# Patient Record
Sex: Female | Born: 1960 | Race: Black or African American | Hispanic: No | Marital: Single | Smoking: Never smoker
Health system: Southern US, Community
[De-identification: ages and names within clinical notes are randomized; demographics above are authoritative.]

## PROBLEM LIST (undated history)

## (undated) DIAGNOSIS — I10 Essential (primary) hypertension: Secondary | ICD-10-CM

## (undated) DIAGNOSIS — E119 Type 2 diabetes mellitus without complications: Secondary | ICD-10-CM

---

## 2017-10-24 ENCOUNTER — Emergency Department (HOSPITAL_COMMUNITY)
Admission: EM | Admit: 2017-10-24 | Discharge: 2017-10-25 | Disposition: A | Discharge status: Home / Self Care | Payer: Non-veteran care | Attending: Emergency Medicine | Admitting: Emergency Medicine

## 2017-10-24 DIAGNOSIS — R0981 Nasal congestion: Secondary | ICD-10-CM | POA: Diagnosis not present

## 2017-10-24 DIAGNOSIS — R0982 Postnasal drip: Secondary | ICD-10-CM | POA: Insufficient documentation

## 2017-10-24 DIAGNOSIS — R05 Cough: Secondary | ICD-10-CM

## 2017-10-24 DIAGNOSIS — R059 Cough, unspecified: Secondary | ICD-10-CM

## 2017-10-24 DIAGNOSIS — E119 Type 2 diabetes mellitus without complications: Secondary | ICD-10-CM | POA: Insufficient documentation

## 2017-10-24 DIAGNOSIS — I10 Essential (primary) hypertension: Secondary | ICD-10-CM | POA: Diagnosis not present

## 2017-10-24 HISTORY — DX: Essential (primary) hypertension: I10

## 2017-10-24 HISTORY — DX: Type 2 diabetes mellitus without complications: E11.9

## 2017-10-25 ENCOUNTER — Encounter (HOSPITAL_COMMUNITY): Payer: Self-pay

## 2017-10-25 ENCOUNTER — Emergency Department (HOSPITAL_COMMUNITY): Payer: Non-veteran care

## 2017-10-25 NOTE — ED Triage Notes (Signed)
Pt reports that she has had a cough with productive green phlegm, denies fevers, [facility] and [facility] and states that cough is not getting better. Pt also asking for resources for homeless shelter.

## 2017-10-25 NOTE — ED Notes (Signed)
Pt ready for discharge VS documented 

## 2017-10-25 NOTE — ED Provider Notes (Signed)
[facility] EMERGENCY DEPARTMENT Provider Note   CSN: [id number] Arrival date & time: 10/24/17  2349     History   Chief Complaint Chief Complaint  Patient presents with  . Cough    HPI Jill Wiggins is a 57 y.o. female.   57 year old female with a history of hypertension, diabetes presents to the ED for complaints of cough.  She has had a cough productive of green phlegm with associated nasal congestion, postnasal drip, sore throat for 1.5 weeks.  She was started on amoxicillin 2 days ago which she has been taking as prescribed.  She feels as though cough drops help slightly with her symptoms.  She is concerned, however, as her cough has persisted despite taking an antibiotic.  Patient expresses that she wants to ensure there is no fluid in her lungs.  She has not had any fevers.  No reported sick contacts.     Past Medical History:  Diagnosis Date  . Diabetes mellitus without complication (HCC)   . Hypertension     There are no active problems to display for this patient.   History reviewed. No pertinent surgical history.   OB History   None      Home Medications    Prior to Admission medications   Not on File    Family History No family history on file.  Social History Social History   Tobacco Use  . Smoking status: Never Smoker  . Smokeless tobacco: Never Used  Substance Use Topics  . Alcohol use: Never    Frequency: Never  . Drug use: Never     Allergies   Patient has no known allergies.   Review of Systems Review of Systems Ten systems reviewed and are negative for acute change, except as noted in the HPI.    Physical Exam Updated Vital Signs BP (!) 150/93 (BP Location: Right Arm)   Pulse 94   Temp 98.6 F (37 C) (Oral)   Resp 18   Ht 5\' 7"  (1.702 m)   Wt 86.2 kg (190 lb)   SpO2 97%   BMI 29.76 kg/m   Physical Exam  Constitutional: She is oriented to person, place, and time. She appears well-developed and  well-nourished. No distress.  Nontoxic appearing and in NAD  HENT:  Head: Normocephalic and atraumatic.  Eyes: Conjunctivae and EOM are normal. No scleral icterus.  Neck: Normal range of motion.  Cardiovascular: Normal rate, regular rhythm and intact distal pulses.  Pulmonary/Chest: Effort normal. No stridor. No respiratory distress. She has no wheezes. She has no rales.  Dry, hacking cough. Lungs CTAB.  Musculoskeletal: Normal range of motion.  Neurological: She is alert and oriented to person, place, and time. She exhibits normal muscle tone. Coordination normal.  GCS 15. Patient moving all extremities.  Skin: Skin is warm and dry. No rash noted. She is not diaphoretic. No erythema. No pallor.  Psychiatric: She has a normal mood and affect. Her behavior is normal.  Nursing note and vitals reviewed.    ED Treatments / Results  Labs (all labs ordered are listed, but only abnormal results are displayed) Labs Reviewed - No data to display  EKG None  Radiology Dg Chest 2 View  Result Date: 10/25/2017 CLINICAL DATA:  Cough EXAM: CHEST - 2 VIEW COMPARISON:  None. FINDINGS: The heart size and mediastinal contours are within normal limits. Both lungs are clear. The visualized skeletal structures are unremarkable. IMPRESSION: No active cardiopulmonary disease. Electronically Signed   By:  Jasmine PangKim  Fujinaga M.D.   On: 10/25/2017 01:28    Procedures Procedures (including critical care time)  Medications Ordered in ED Medications - No data to display   Initial Impression / Assessment and Plan / ED Course  I have reviewed the triage vital signs and the nursing notes.  Pertinent labs & imaging results that were available during my care of the patient were reviewed by me and considered in my medical decision making (see chart for details).     Patient presenting for persistent cough x 1.5 weeks. CXR negative for acute infiltrate. Patient's symptoms are consistent with URI, though she is  currently on a course of Amoxicillin for PNA and sinusitis coverage.  Patient encouraged to continue with symptomatic treatment.  She verbalizes understanding and is agreeable with plan. Return precautions discussed and provided. Patient discharged in stable condition with no unaddressed concerns.  Vitals:   10/25/17 0002  BP: (!) 150/93  Pulse: 94  Resp: 18  Temp: 98.6 F (37 C)  TempSrc: Oral  SpO2: 97%  Weight: 86.2 kg (190 lb)  Height: 5\' 7"  (1.702 m)    Final Clinical Impressions(s) / ED Diagnoses   Final diagnoses:  Cough    ED Discharge Orders    None       Antony MaduraHumes, Izaan Kingbird, PA-C 10/25/17 Joan [id number]    Shaune PollackIsaacs, Cameron, MD 10/25/17 [phone]

## 2017-10-25 NOTE — Discharge Instructions (Signed)
Continue your antibiotic as well as over-the-counter medications for symptom control.  You may use the cough medicine previously prescribed to you as needed.  Use as instructed on your prescription.  Follow-up with a primary care doctor for further evaluation of symptoms

## 2018-04-19 DIAGNOSIS — F4312 Post-traumatic stress disorder, chronic: Secondary | ICD-10-CM

## 2018-04-19 DIAGNOSIS — F419 Anxiety disorder, unspecified: Secondary | ICD-10-CM

## 2018-04-20 ENCOUNTER — Inpatient Hospital Stay
Admit: 2018-04-20 | Discharge: 2018-04-20 | Disposition: A | Discharge status: Home / Self Care | Source: Home / Self Care

## 2018-04-20 NOTE — ED Provider Notes
[facility]  Emergency Department Service Report    Carolyn Carpenter 57 y.o. female , presents with Psychiatric Evaluation and Anxiety      Triage   Arrived on 04/19/2018 at 3:56 PM   Arrived by Walk-in [14]    ED Triage Vitals [04/19/18 1559]   Temp Temp Source BP Heart Rate Resp SpO2 O2 Device Pain Score Weight   36.8 ???C (98.2 ???F) Oral 154/72 84 17 98 % None (Room air) -- --       Pre hospital care:       No Known Allergies    History   Patient is a 57 y.o. female with hx of anxiety and PTSD who presents to the ED with complaint of acute on chronic anxiety starting today. Sx are mild, intermittent, unchanging, worse with thoughts of flying, no alleviating factors.    The history is provided by the patient. No language interpreter was used.   Anxiety              Past Medical History:   Diagnosis Date   ??? Anxiety    ??? PTSD (post-traumatic stress disorder)         Past Surgical History:   Procedure Laterality Date   ??? anal sphincter repair          Past Family History   Family history reviewed by me and there is no pertinent past family history related to patient's current case and/or care.      Past Social History   she reports that she has never smoked. She does not have any smokeless tobacco history on file. No history on file for alcohol, drug, and sexual activity.     Review of Systems   Psychiatric/Behavioral: The patient is nervous/anxious.    All other systems reviewed and are negative.      Physical Exam   Physical Exam  Vitals signs and nursing note reviewed.   Constitutional:       General: She is not in acute distress.     Appearance: She is well-developed.   HENT:      Head: Normocephalic and atraumatic.   Eyes:      Conjunctiva/sclera: Conjunctivae normal.   Neck:      Musculoskeletal: Neck supple.   Pulmonary:      Effort: Pulmonary effort is normal.   Musculoskeletal: Normal range of motion.   Skin:     General: Skin is warm and dry.   Neurological: Mental Status: She is alert.   Psychiatric:         Mood and Affect: Mood and affect normal.         Behavior: Behavior normal. Behavior is cooperative.      Comments: Calm, pleasant, appropriate         ED Course          Laboratory Results   Labs Reviewed - No data to display    Imaging Results     No orders to display       Administered Medications     Medication Administration from 04/19/2018 1556 to 04/19/2018 1652     None          Procedures   Procedures    MDM  Number of Diagnoses or Management Options  Acute anxiety:   Chronic post-traumatic stress disorder (PTSD):      Amount and/or Complexity of Data Reviewed  Decide to obtain previous medical records or to obtain history from someone  other than the patient: yes  Review and summarize past medical records: yes        ED Course:  Nursing note reviewed.  Previous medical records were obtained and reviewed by myself.     5:18 PM I visited the patient to obtain history and perform the physical exam.      Medical Decision Making:  Faiga Meland presents to the ED today with chief complaint of anxiety.  On exam, the patient exhibits calm, pleasant, appropriate behavior.      Given the patient's physical exam my impression is the patient is experiencing an episode of chronic post-traumatic stress disorder and acute anxiety.   Therefore, I feel comfortable discharging the patient home with close outpatient follow up.    Discharge Instructions:  I advised the patient to follow up with their primary care physician on an outpatient basis over the next 24-48 hours, and recommended prompt  return to the emergency department if they have additional concerns or note worsening symptoms.  The patient indicates understanding of these issues and agrees with the plan.  A copy of the ED  workup results was provided to the patient as well.        Clinical Impression     1. Chronic post-traumatic stress disorder (PTSD)    2. Acute anxiety        Prescriptions New Prescriptions    No medications on file       Disposition and Follow-up   Disposition: Discharge [1]    No future appointments.    Follow up with:  [facility]  [address]  [place] [address]  [phone]      As needed      Return precautions are specified on After Visit Summary.    The documentation on this chart was performed by [place] Molina, scribed for Dewayne Shorter., MD     04/19/2018 5:18 PM     All scribe entries and documentation made by the scribe were entered at my direction.  I have reviewed this medical record and agree to the accuracy and completeness of the content entered by the scribe.  The documentation recorded by the scribe accurately reflects the service I personally performed and the decisions made by me.    Shanikqua Zarzycki 9:40 PM                   Pablo Ledger, MD  04/19/18 2140

## 2019-01-28 IMAGING — DX DG CHEST 2V
2 series · 2 of 2 positions shown · non-contrast
Comparison: None.

CLINICAL DATA: Cough

EXAM:
CHEST - 2 VIEW

[w chest pa]
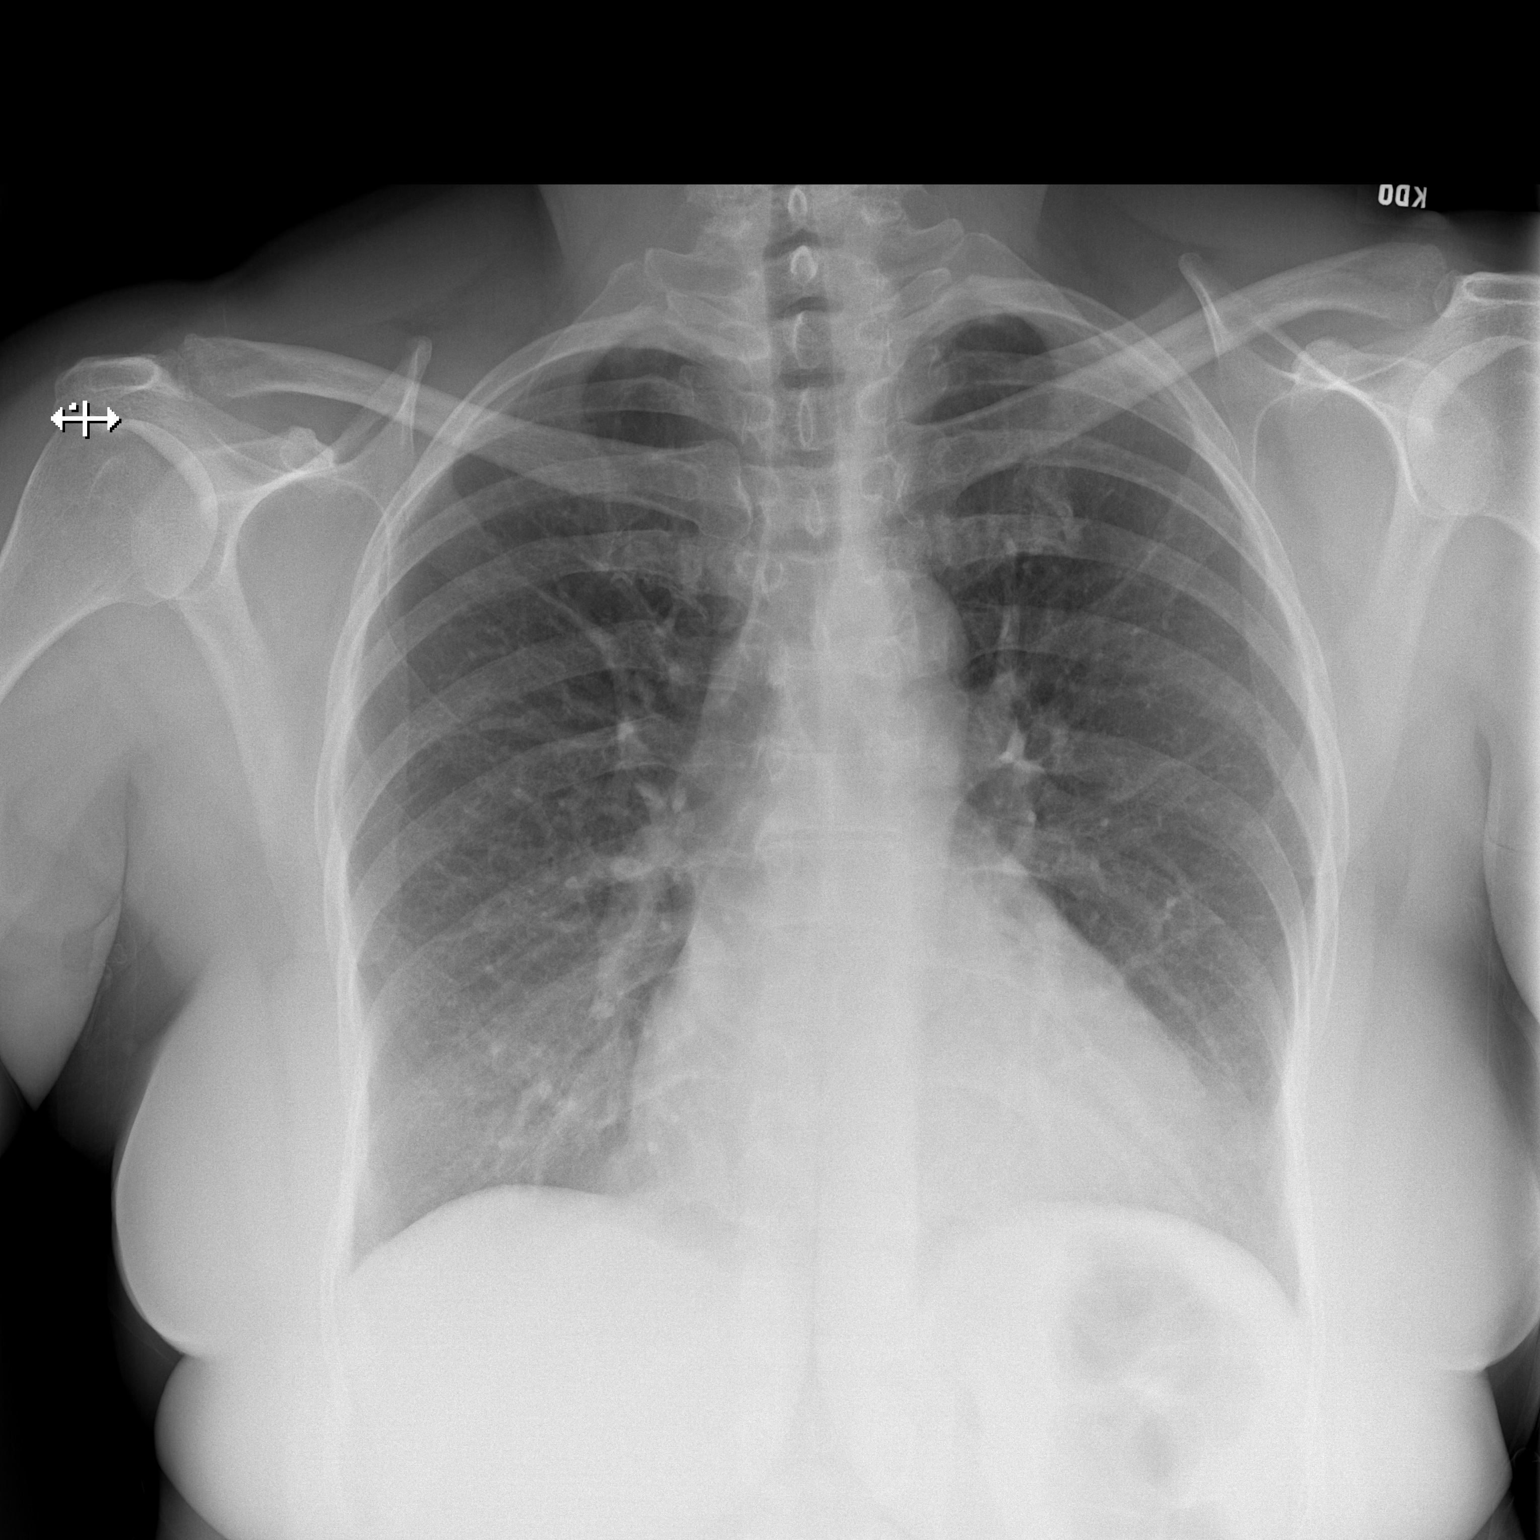

[w chest lat]
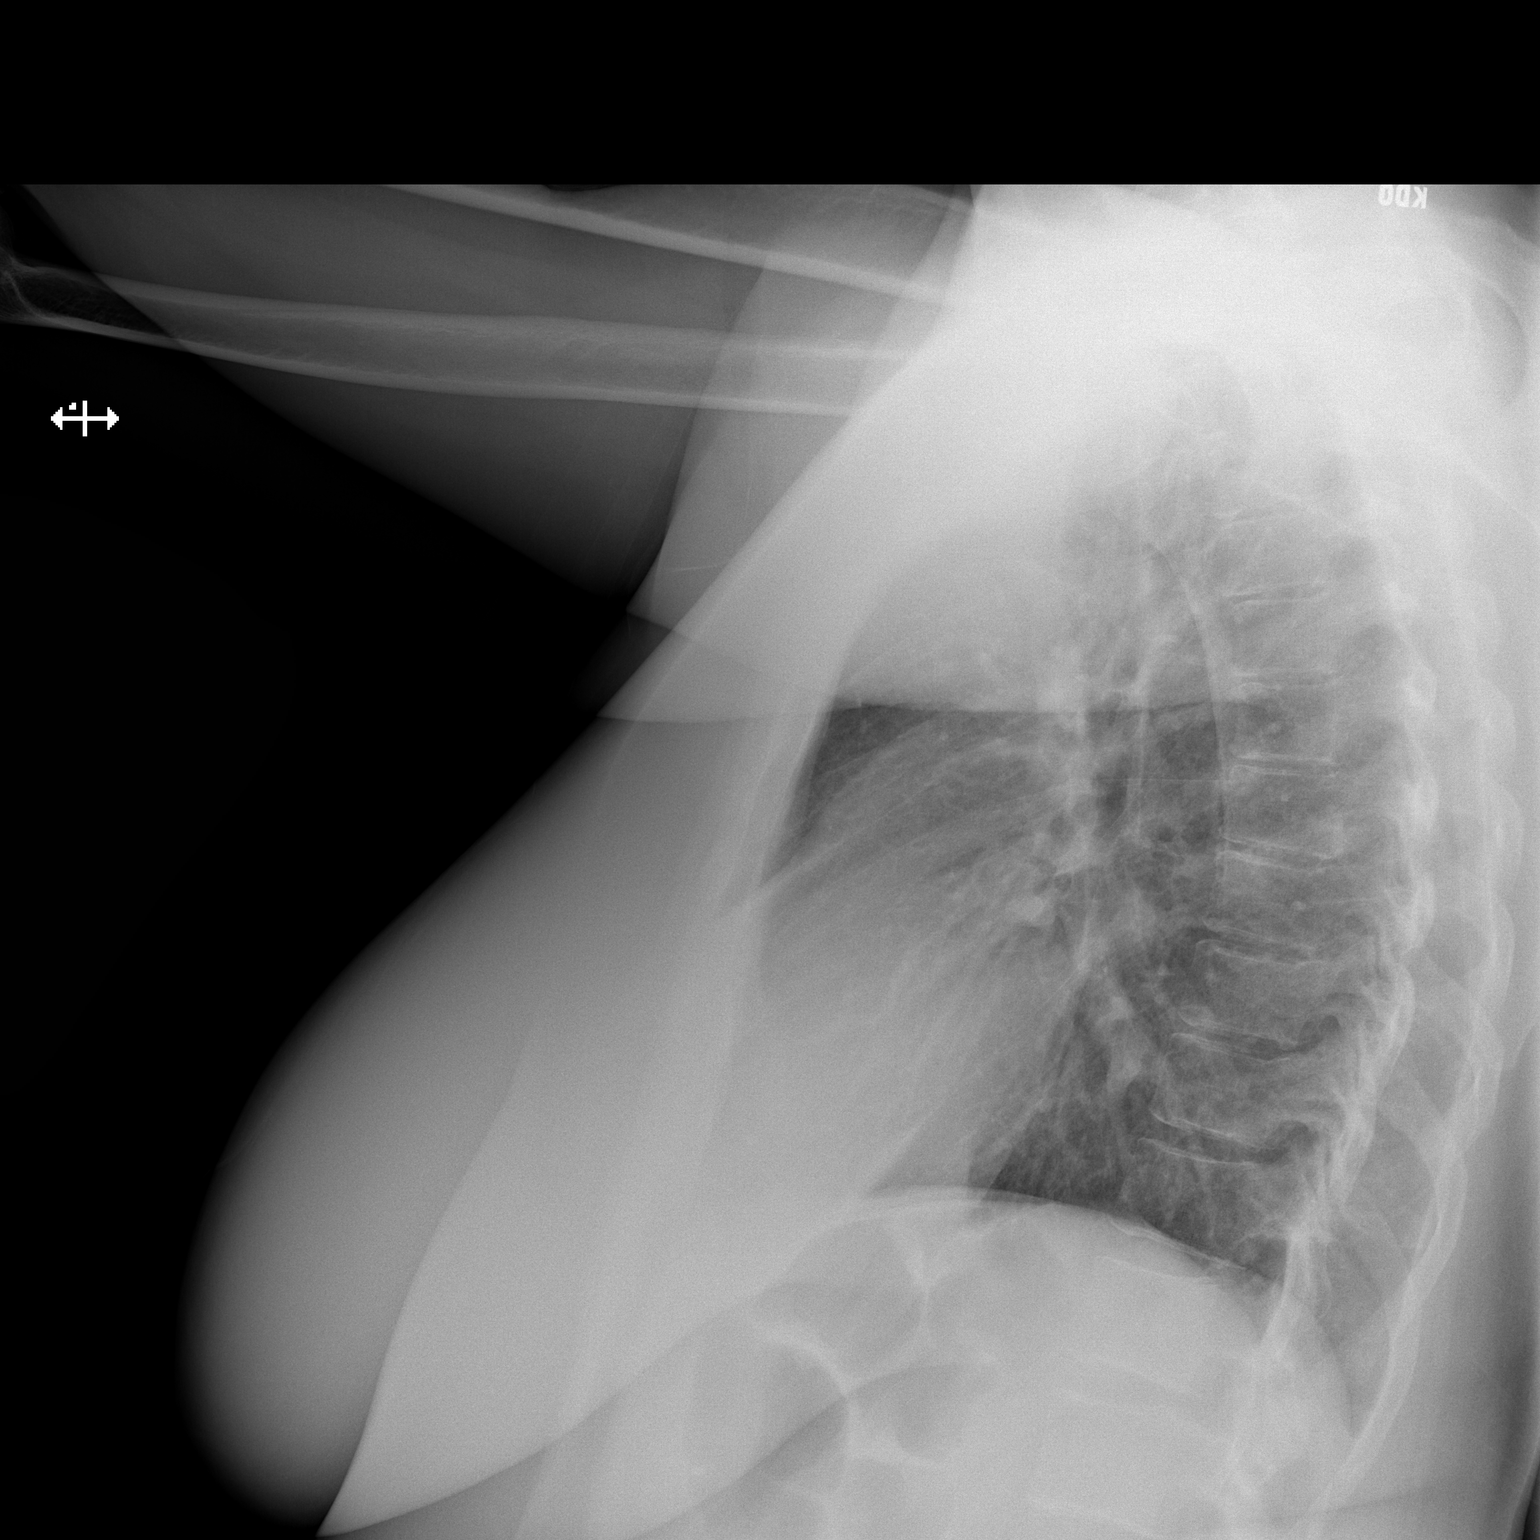

[2 of 2 positions shown; findings below may reference images not displayed]

FINDINGS: The heart size and mediastinal contours are within normal limits.
Both lungs are clear. The visualized skeletal structures are
unremarkable.
IMPRESSION: No active cardiopulmonary disease.

## 2020-05-14 ENCOUNTER — Inpatient Hospital Stay
Admit: 2020-05-14 | Discharge: 2020-05-15 | Disposition: A | Discharge status: Home / Self Care | Source: Home / Self Care

## 2020-05-14 DIAGNOSIS — F411 Generalized anxiety disorder: Secondary | ICD-10-CM

## 2020-05-14 MED ADMIN — LORAZEPAM 1 MG PO TABS: 1 mg | ORAL | @ 23:00:00 | Stop: 2020-05-14 | NDC 00904600861

## 2020-05-14 NOTE — ED Provider Notes
[facility]  Emergency Department Service Report    Carolyn Carpenter 60 y.o. female , presents with Anxiety      Triage   Arrived on 05/14/2020 at 2:15 PM   Arrived by Walk-in [14]    ED Triage Vitals   Temp Temp Source BP Heart Rate Resp SpO2 O2 Device Pain Score Weight   05/14/20 1427 05/14/20 1427 05/14/20 1425 05/14/20 1425 05/14/20 1425 05/14/20 1425 05/14/20 1425 -- --   37 ???C (98.6 ???F) Oral 156/95 (!) 107 20 98 % None (Room air)         Pre hospital care:       No Known Allergies    History   HPI       Patient is a 60 y.o. female with hx of anxiety, PTSD who presents to the ED with complaint of anxiety.  Pt states she was living in [place] for a year in order to take care of her parents and recently moved back to [facility] 1 week ago.  Her mother, brother, and sister have all passed within the past 2 years which has induced a lot of stress and anxiety.  The pt is now establishing herself at a women's clinic but is unable to see a Dr until 2 days from now.  She was taking lorazepam but reports it did not help her anxiety much.  She last took lorazepam and ativan a week ago given to her at the ED after having a panic attack.  Pt was given 12 pills of ativan and has now run out.  She also takes BP meds.  Denies being hospitalised for psych sx, SI, HI, hallucinations, fever, chills, V/N/D, CP.  Is not COVID vaccinated but is planning on getting vaccinated soon once she gets a Dr. at the [facility] clinic.    Past Medical History:   Diagnosis Date   ??? Anxiety    ??? PTSD (post-traumatic stress disorder)         Past Surgical History:   Procedure Laterality Date   ??? anal sphincter repair          Past Family History   Family history reviewed by me and there is no pertinent past family history related to the patient's current case and/or care.             Past Social History   she reports that she has never smoked. She does not have any smokeless tobacco history on file. No history on file for alcohol use, drug use, and sexual activity.     Review of Systems   Constitutional: Negative for chills and fever.   Cardiovascular: Negative for chest pain.   Gastrointestinal: Negative for diarrhea, nausea and vomiting.   Psychiatric/Behavioral: Negative for hallucinations, self-injury and suicidal ideas. The patient is nervous/anxious.    All other systems reviewed and are negative.      Physical Exam   Physical Exam  Constitutional:       General: She is not in acute distress.     Appearance: Normal appearance.   HENT:      Head: Normocephalic and atraumatic.      Right Ear: External ear normal.      Left Ear: External ear normal.      Nose: Nose normal.      Mouth/Throat:      Mouth: Mucous membranes are moist.      Pharynx: Oropharynx is clear.   Eyes:  General:         Right eye: No discharge.         Left eye: No discharge.      Extraocular Movements: Extraocular movements intact.      Conjunctiva/sclera: Conjunctivae normal.   Cardiovascular:      Rate and Rhythm: Normal rate and regular rhythm.      Pulses: Normal pulses.      Heart sounds: Normal heart sounds. No murmur heard.      Pulmonary:      Effort: Pulmonary effort is normal. No respiratory distress.      Breath sounds: Normal breath sounds. No stridor. No wheezing.   Abdominal:      General: There is no distension.      Palpations: Abdomen is soft.      Tenderness: There is no abdominal tenderness. There is no guarding.   Musculoskeletal:         General: No swelling, tenderness or deformity. Normal range of motion.      Cervical back: Normal range of motion and neck supple. No rigidity.   Skin:     General: Skin is warm and dry.      Findings: No rash.   Neurological:      General: No focal deficit present.      Mental Status: She is alert and oriented to person, place, and time.      Cranial Nerves: No cranial nerve deficit.      Sensory: No sensory deficit.      Motor: No weakness.      Coordination: Coordination normal.      Gait: Gait normal.   Psychiatric:         Mood and Affect: Mood normal.         Behavior: Behavior normal.      Comments: No SI, HI, evidence of self-harm  Not responding to stimuli         ED Course          Laboratory Results   Labs Reviewed - No data to display    Imaging Results     No orders to display       Administered Medications     Medication Administration from 05/14/2020 1415 to 05/14/2020 1447     None          Procedures   Procedural Sedation  Procedures    MDM  Medical Decision Making:  Sally-Anne Wamble presents to the ED today with chief complaint of anxiety.  On exam, the patient exhibits no SI, HI, evidence of self-harm. Not responding to stimuli.     ***FOR DISCHARGE***Given the patient's physical exam, laboratory analysis***, and imaging studies***, my impression is the patient is experiencing an episode of ***.  Therefore, I feel comfortable discharging the patient home with close outpatient follow up.    Discharge Instructions:  I advised the patient to follow up with their primary care physician on an outpatient basis over the next 24-48 hours, and recommended prompt  return to the emergency department if they have additional concerns or note worsening symptoms.  The patient indicates understanding of these issues and agrees with the plan.  A copy of the ED  workup results was provided to the patient as well.    ***FOR ADMIT***Given the patient's physical exam, laboratory analysis, and imaging studies, my impression is the patient is experiencing an episode of ***. Given the above findings and discussion, I strongly believe the patient will greatly  benefit from an inpatient admission for further evaluation and treatment.      CC/OBS Time?***        Clinical Impression   No diagnosis found.    Prescriptions     New Prescriptions    No medications on file       Disposition and Follow-up   Disposition: Refresh note to pull in Disposition    No future appointments.    Follow up with:  No follow-up provider specified.    Return precautions are specified on After Visit Summary.        The documentation on this chart was performed by Jenne Campus, scribed for Tommi Rumps., DO    05/14/2020 2:47 PM     ***

## 2020-05-15 MED ORDER — LORAZEPAM 1 MG PO TABS
1 mg | ORAL_TABLET | Freq: Three times a day (TID) | ORAL | 0 refills | Status: AC | PRN
Start: 2020-05-15 — End: ?

## 2020-05-15 NOTE — Progress Notes
CLINICAL SOCIAL WORKER BRIEF ASSESSMENT      Admit Date:    Date of CSW Assessment: 05/14/2020    Problems: Active Problems:    * No active hospital problems. *       Past Medical History:   Diagnosis Date   ??? Anxiety    ??? PTSD (post-traumatic stress disorder)     Past Surgical History:   Procedure Laterality Date   ??? anal sphincter repair            Primary Care Physician:Pcp, No, MD  Phone:None       ASSESSMENT:     Date Seen: 05/14/20    Date Referred: 05/14/20    Referral Source: Other (comment)    Reason for Referral: Homelessness,History of Mental Health    Source of Information: Persons interviewed,Review of medical record               Interview Source(s): Patient            lnterventions: Discussed role of CSW and informed CSW availability for support and resources,Gathered history,Provided education regarding,Processed and explored feelings related to diagnosis/treatment,Provided empathetic listening and emotional support,Assessed high risk issues,Discussed health/safety issues,Discussed resources available    Outcome:  SW met pt at bedside due to homeless status on ED track board. Pt presents as alert and friendly x4, cooperative and friendly, anxious mood, congruent affect, and linear thought process. 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 Pt denies any history of suicide attempts, including SI/HI. Pt denies SI/HI today. She denies any AHs/VHs.  Pt states she came to the ED for anxiety medication.     Pt reports receiving $3100 a month from [place] service-connected disability benefits. Pt is service connected for PTSD at the [place].  Pt reports staying a hotel at this time paid by her own funds. Pt reports she came as a walk-in and utilized the bus. Pt declined any homeless resources, including food, bus pass, and clothing at this time. Pt states she has her basic needs met at this time. Pt reports a history of taking anti-anxiety medication but discontinued due to falling out of care with the [place]. Pt moved to [place] awhile back to take care of relatives. Pt returned to [facility] a week ago and states she is trying to re-establish care with the [place] for mental health, primary care, group classes for emotional support, and permanent supportive housing through the [facility]. Pt reports she plans to show on Monday 05/16/20 to meet with clinical staff and reconnect with care at the [facility]). Pt was unable to go to the [place] today for her medication.  Pt declined declined additional MH resources. Pt reports receiving social support from family, two adult children, and a local church where she receives counseling. Pt states she has configured a plan to follow-up with counseling and mental health care next week. Pt declined any SW services at this time.     NOTE: pt left the ED prior to signing the homeless d/c form.         Plan/Recommendations: Disposition Pending                Projected Date of Discharge:             [facility], [place],  05/14/2020

## 2020-05-15 NOTE — ED Notes
Patient given prescription for lorazepam by MD.

## 2021-11-26 ENCOUNTER — Inpatient Hospital Stay: Admit: 2021-11-26 | Discharge: 2021-11-26 | Discharge status: Against Medical Advice | Source: Home / Self Care

## 2021-11-26 NOTE — ED Notes
[facility]?NOTIFICATION?11/25/2021 18:03?Carolyn Carpenter, Carolyn Carpenter?MRN: [id number]    [facility] patient encounter information:   EXB:?[id number]  Account [id number]  Billing Account [id number]      Criteria Met      2 Visits in 30 Days    Security and Safety  No Security Events were found.  ED Care Guidelines  There are currently no ED Care Guidelines for this patient. Please check your facility[facility] medical records system.          Prescription Drug Data  No Prescription Drug Data was found.    E.D. Visit Count (12 mo.)  Facility Visits   [facility]  2   [facility] 1   [facility] 1   [facility] 1   Total 5   Note: Visits indicate total known visits.     Recent Emergency Department Visit Summary  Date Facility [facility] Type Diagnoses or Chief Complaint    Nov 25, 2021  [facility].  [place]  Emergency     Nov 24, 2021  [facility]  [place]  Emer[place]      0. RT LEG PAIN      Feb 11, 2021  [facility] [place]  [place]  Emergency      0. BP CHECK      1. Essential (primary) hypertension      2. Unvaccinated for COVID-19      2. Immunization not carried out because of patient decision for unspecified reason      2. Procedure and treatment not carried out because of patient[facility] decision for unspecified reasons      Jan 29, 2021  [facility]  [facility]  Emergency      hypertension      Jan 25, 2021  [facility].  [place].  [place]  Emergency      Med Refill      MULTI COMP        Recent Inpatient Visit Summary  No Recent Inpatient Visits were found.  Care Team  No Care Team was found.  [facility]  This patient has registered at the [facility] Emergency Department   For more information visit: [email or web address]   PLEASE NOTE:     1.   Any care recommendations and other clinical information are provided as guidelines or for historical purposes only, and providers should exercise their own clinical judgment when providing care.    2.   You may only use this information for purposes of treatment, payment or health care operations activities, and subject to the limitations of applicable [facility] Policies.    3.   You should consult directly with the organization that provided a care guideline or other clinical history with any questions about additional information or accuracy or completeness of information provided.    ? 2023 [employer] - [email or web address]

## 2021-11-30 DIAGNOSIS — R519 Facial pain: Secondary | ICD-10-CM
